# Patient Record
Sex: Female | Born: 1965 | Race: White | Hispanic: No | Marital: Married | Smoking: Never smoker
Health system: Southern US, Community
[De-identification: ages and names within clinical notes are randomized; demographics above are authoritative.]

## PROBLEM LIST (undated history)

## (undated) DIAGNOSIS — Z789 Other specified health status: Secondary | ICD-10-CM

## (undated) HISTORY — PX: ABDOMINAL HYSTERECTOMY: SHX81

## (undated) HISTORY — PX: BLADDER SURGERY: SHX569

---

## 2007-06-09 ENCOUNTER — Ambulatory Visit: Payer: Self-pay | Admitting: Surgery

## 2008-05-16 IMAGING — CT CT ABD-PELV W/ CM
1 of 2 series · 15 of 32 positions shown, 19 images · non-contrast
Comparison: none

REASON FOR EXAM: LYMPHADENOPATHY
COMMENTS:

PROCEDURE:     AMAZIGH - AMAZIGH ABDOMEN / PELVIS W  - June 09, 2007  [DATE]
RESULT:     CT of the abdomen and pelvis is performed utilizing 100 ml of
Msovue-VWW iodinated intravenous contrast along with oral contrast.

[Series 2: soft tissue · axial · 0.67mm/px · z∈[+267,+675]mm · 15 of 57 slices shown, 19 images]
[im 3/57  soft-tissue]
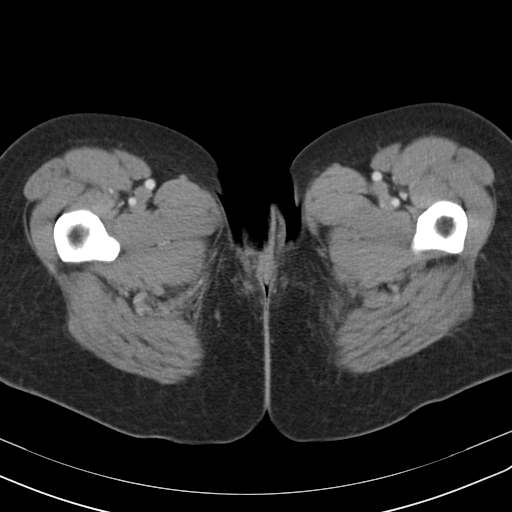
[im 3/57  bone]
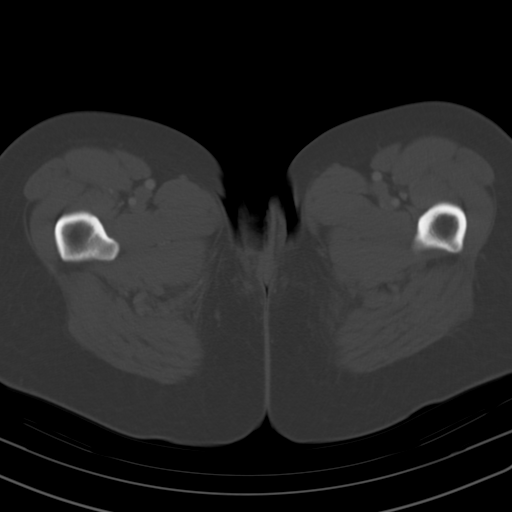
[im 8/57  soft-tissue]
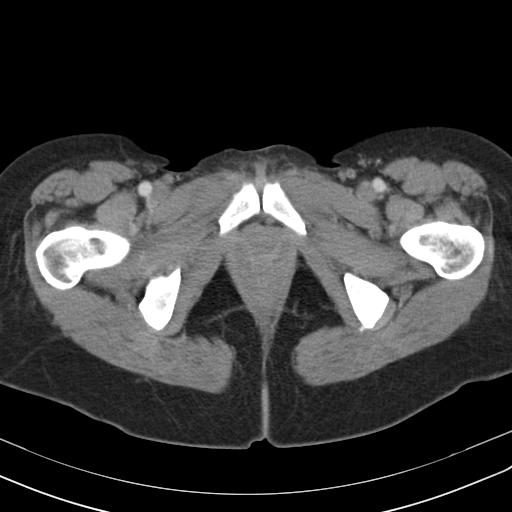
[im 13/57  soft-tissue]
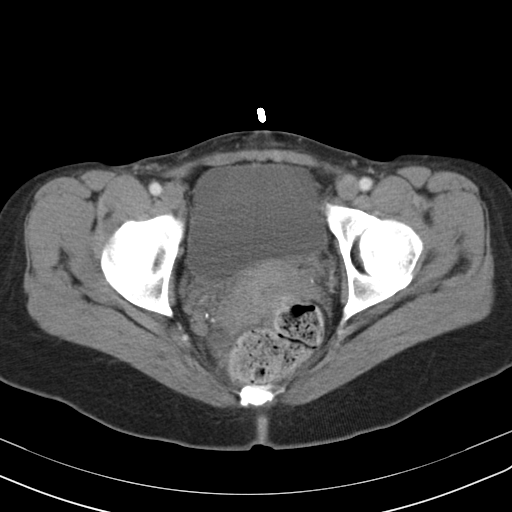
[im 16/57  soft-tissue]
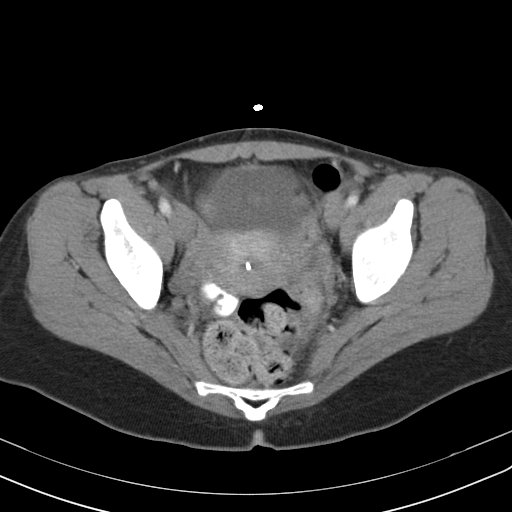
[im 21/57  soft-tissue]
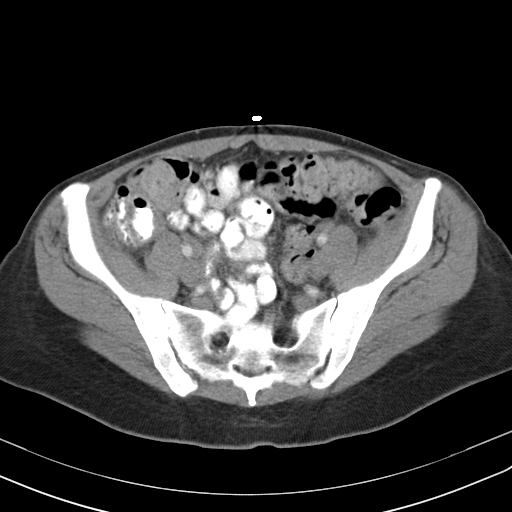
[im 23/57  soft-tissue]
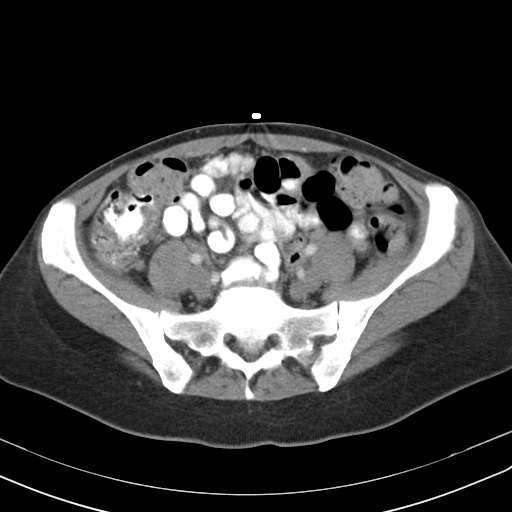
[im 29/57  soft-tissue]
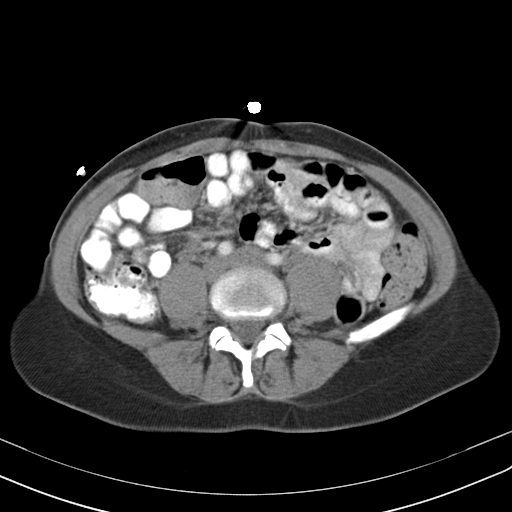
[im 34/57  soft-tissue]
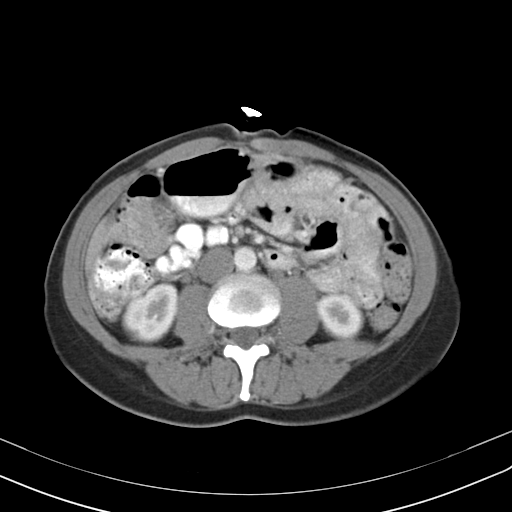
[im 36/57  soft-tissue]
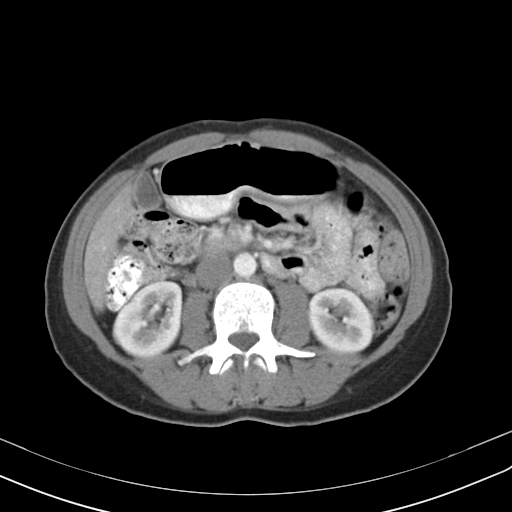
[im 36/57  bone]
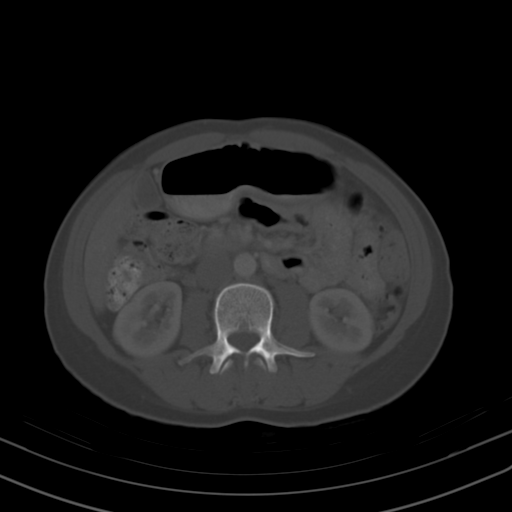
[im 41/57  soft-tissue]
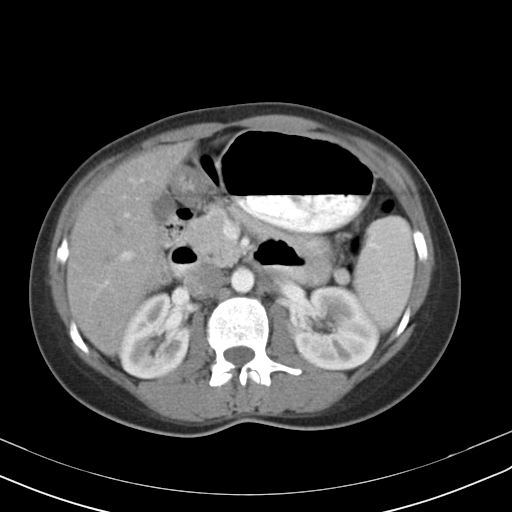
[im 44/57  soft-tissue]
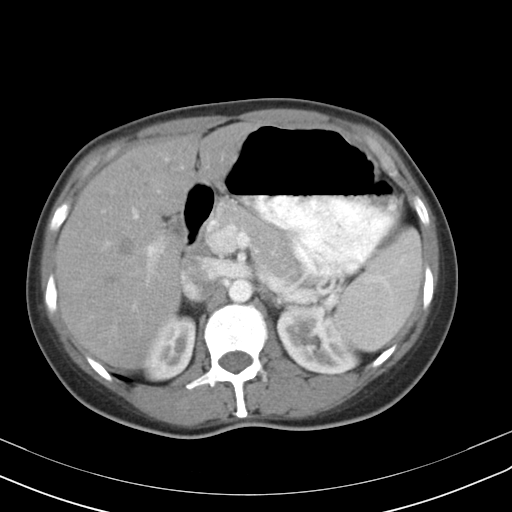
[im 46/57  lung]
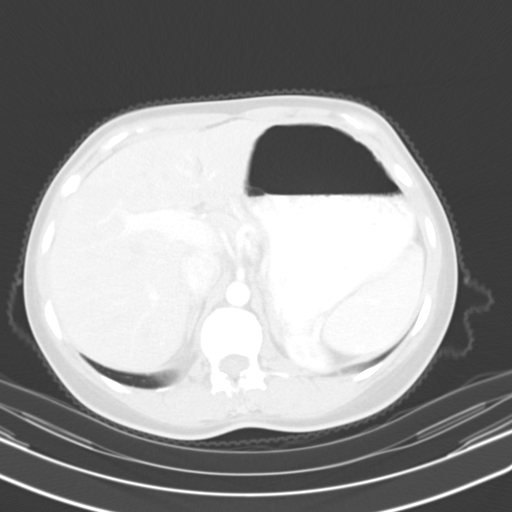
[im 49/57  soft-tissue]
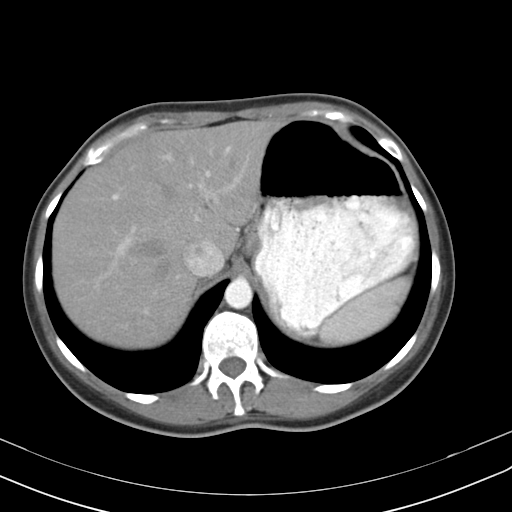
[im 49/57  lung]
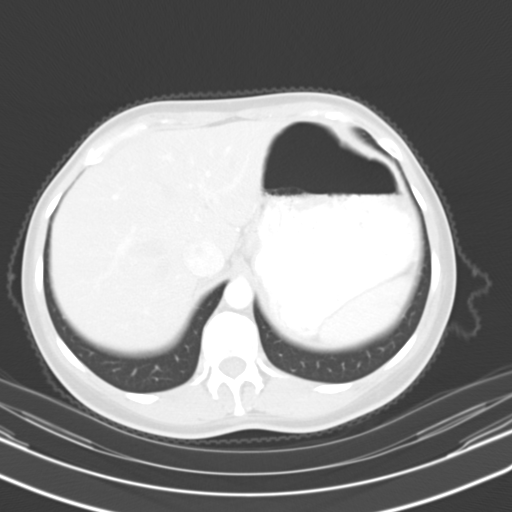
[im 51/57  lung]
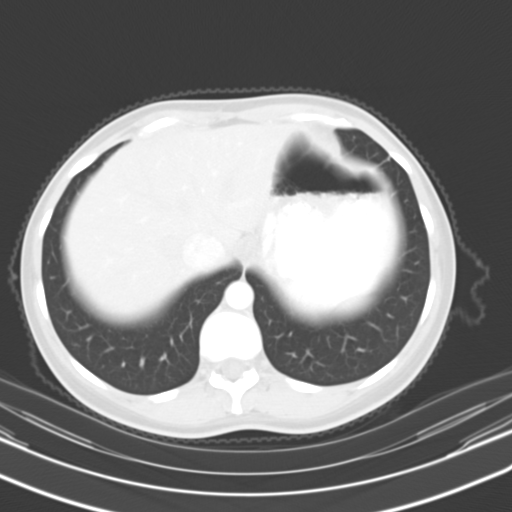
[im 54/57  soft-tissue]
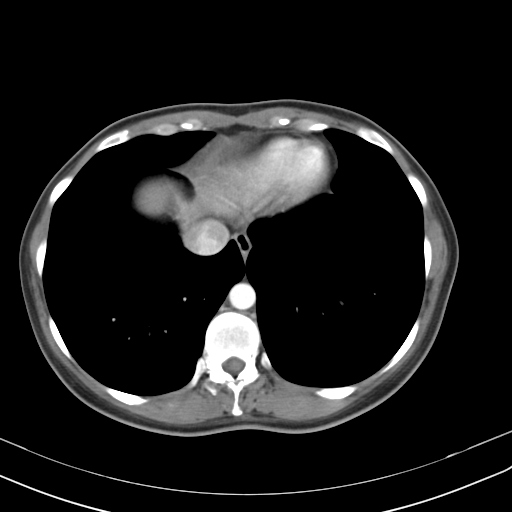
[im 54/57  lung]
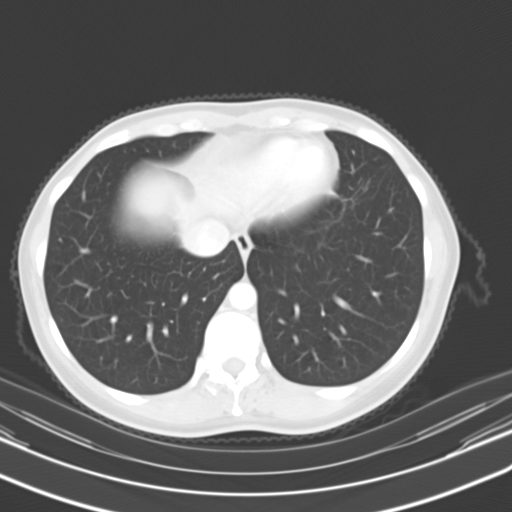

[15 of 32 positions shown; findings below may reference images not displayed]

FINDINGS: Images through the base of the lungs show normal aeration with no
infiltrate, nodule or pneumothorax. There is no effusion. The liver is free
of masses or enlargement. The spleen is unremarkable. The pancreas appears
to be normal. The adrenal glands are unremarkable. The gallbladder shows no
definite stones. There is no abnormal bowel distention. There is no
mesenteric, retroperitoneal or pelvic adenopathy by size criteria. There are
multiple phleboliths, greater on the RIGHT than on the LEFT. There is an
intrauterine device present. There is no evidence of an adnexal mass.
IMPRESSION: No evidence of adenopathy. Unremarkable CT of the abdomen
and pelvis otherwise.

## 2016-02-29 ENCOUNTER — Ambulatory Visit
Admission: EM | Admit: 2016-02-29 | Discharge: 2016-02-29 | Disposition: A | Discharge status: Home / Self Care | Payer: BLUE CROSS/BLUE SHIELD | Attending: Family Medicine | Admitting: Family Medicine

## 2016-02-29 DIAGNOSIS — R21 Rash and other nonspecific skin eruption: Secondary | ICD-10-CM

## 2016-02-29 DIAGNOSIS — B851 Pediculosis due to Pediculus humanus corporis: Secondary | ICD-10-CM

## 2016-02-29 MED ORDER — PERMETHRIN 5 % EX CREA: 1.0000 "application " | TOPICAL_CREAM | Freq: Once | CUTANEOUS | 1 refills | Status: AC

## 2016-02-29 MED ORDER — RANITIDINE HCL 150 MG PO CAPS: 150.0000 mg | ORAL_CAPSULE | Freq: Two times a day (BID) | ORAL | 1 refills | Status: DC

## 2016-02-29 MED ORDER — PREDNISONE 10 MG (21) PO TBPK: ORAL_TABLET | ORAL | 1 refills | Status: DC

## 2016-02-29 NOTE — ED Triage Notes (Signed)
Patient complains of bug bite areas that started over one week ago and have been spreading. Patient states that area is itchy. Patient states that she has tried triamcinolone and benadryl without relief.

## 2016-02-29 NOTE — ED Provider Notes (Signed)
[facility]    CSN: [id number] Arrival date & time: 02/29/16  0901     History   Chief Complaint Chief Complaint  Patient presents with  . Rash    HPI Michelle Barber is a 50 y.o. female.   Patient's here because of rash. She reports the rash started on the left leg. This started about a week ago she's had some changes in her household where a family member had significant bed and she recently finally was able to get back into her bed about a week ago. Since then she saw the dermatologist earlier this week and thought it was simple immune response to some bedbugs or insect that may have bit him however in the last few days she still is a new lesion on her neck on both courses of her hands and new lesion on her right leg as well. These lesions are very pruritic and she said cover them with Band-Aids and she doesn't scratch. Of course when she scratched the lower leg lesions they felt much better. Left lower leg lesions and was also swollen and inflamed from her scratching.  . Patient's had a abdominal hysterectomy bladder tacking surgery. History colon cancer and breast cancer in the family. He does not smoke no known drug allergies.   The history is provided by the patient. No language interpreter was used.  Rash  Location:  Hand, head/neck and leg Head/neck rash location:  R neck Hand rash location:  Dorsum of L hand and R wrist Leg rash location:  L lower leg and R lower leg Severity:  Moderate Onset quality:  Sudden Timing:  Constant Progression:  Spreading Context: animal contact   Relieved by:  Nothing Ineffective treatments:  Topical steroids and antihistamines Associated symptoms: induration     History reviewed. No pertinent past medical history.  There are no active problems to display for this patient.   Past Surgical History:  Procedure Laterality Date  . ABDOMINAL HYSTERECTOMY    . BLADDER SURGERY     Bladder Tac    OB History    No data  available       Home Medications    Prior to Admission medications   Medication Sig Start Date End Date Taking? Authorizing Provider  Cholecalciferol (VITAMIN D PO) Take by mouth.   Yes Historical Provider, MD  permethrin (ELIMITE) 5 % cream Apply 1 application topically once. 02/29/16 02/29/16  Hassan RowanEugene Leanard Dimaio, MD  predniSONE (STERAPRED UNI-PAK 21 TAB) 10 MG (21) TBPK tablet Sig 6 tablet day 1, 5 tablets day 2, 4 tablets day 3,,3tablets day 4, 2 tablets day 5, 1 tablet day 6 take all tablets orally 02/29/16   Hassan RowanEugene Bransyn Adami, MD  ranitidine (ZANTAC) 150 MG capsule Take 1 capsule (150 mg total) by mouth 2 (two) times daily. 02/29/16   Hassan RowanEugene Governor Matos, MD    Family History Family History  Problem Relation Age of Onset  . Breast cancer Mother   . Colon cancer Paternal Grandmother     Social History Social History  Substance Use Topics  . Smoking status: Never Smoker  . Smokeless tobacco: Never Used  . Alcohol use Yes     Comment: rare     Allergies   Review of patient's allergies indicates no known allergies.   Review of Systems Review of Systems  Skin: Positive for rash.  All other systems reviewed and are negative.    Physical Exam Triage Vital Signs ED Triage Vitals  Enc Vitals Group  BP 02/29/16 0926 125/80     Pulse Rate 02/29/16 0926 60     Resp 02/29/16 0926 16     Temp 02/29/16 0926 98.1 F (36.7 C)     Temp Source 02/29/16 0926 Tympanic     SpO2 02/29/16 0926 100 %     Weight 02/29/16 0927 153 lb (69.4 kg)     Height 02/29/16 0927 5\' 7"  (1.702 m)     Head Circumference --      Peak Flow --      Pain Score 02/29/16 0928 0     Pain Loc --      Pain Edu? --      Excl. in GC? --    No data found.   Updated Vital Signs BP 125/80 (BP Location: Left Arm)   Pulse 60   Temp 98.1 F (36.7 C) (Tympanic)   Resp 16   Ht 5\' 7"  (1.702 m)   Wt 153 lb (69.4 kg)   SpO2 100%   BMI 23.96 kg/m   Visual Acuity Right Eye Distance:   Left Eye Distance:     Bilateral Distance:    Right Eye Near:   Left Eye Near:    Bilateral Near:     Physical Exam  Constitutional: She is oriented to person, place, and time. She appears well-developed and well-nourished.  HENT:  Head: Normocephalic.  Eyes: Pupils are equal, round, and reactive to light.  Neck: Normal range of motion.  Pulmonary/Chest: Effort normal.  Musculoskeletal: Normal range of motion.  Neurological: She is alert and oriented to person, place, and time.  Skin: Rash noted. There is erythema.     Psychiatric: She has a normal mood and affect.  Vitals reviewed.    UC Treatments / Results  Labs (all labs ordered are listed, but only abnormal results are displayed) Labs Reviewed - No data to display  EKG  EKG Interpretation None       Radiology No results found.  Procedures Procedures (including critical care time)  Medications Ordered in UC Medications - No data to display   Initial Impression / Assessment and Plan / UC Course  I have reviewed the triage vital signs and the nursing notes.  Pertinent labs & imaging results that were available during my care of the patient were reviewed by me and considered in my medical decision making (see chart for details).  Clinical Course    We have both come to the conclusion that this is probably some form of pediculosis infection. With the spreading of the lesions and no new known bites being seen or fracture seen will treat with Elimite lotion Zantac and Claritin for the itching she has Claritin at home will place on 6 day course of prednisone for the immunoresponse that she has experienced.  Final Clinical Impressions(s) / UC Diagnoses   Final diagnoses:  Rash and nonspecific skin eruption  Pediculosis corporis    New Prescriptions New Prescriptions   PERMETHRIN (ELIMITE) 5 % CREAM    Apply 1 application topically once.   PREDNISONE (STERAPRED UNI-PAK 21 TAB) 10 MG (21) TBPK TABLET    Sig 6 tablet day 1, 5  tablets day 2, 4 tablets day 3,,3tablets day 4, 2 tablets day 5, 1 tablet day 6 take all tablets orally   RANITIDINE (ZANTAC) 150 MG CAPSULE    Take 1 capsule (150 mg total) by mouth 2 (two) times daily.     Hassan RowanEugene Arben Packman, MD 02/29/16 1023

## 2016-03-03 ENCOUNTER — Telehealth: Payer: Self-pay

## 2016-03-03 NOTE — Telephone Encounter (Signed)
Courtesy call back completed today for patient's recent visit at [facility]. Patient did not answer, left message on machine to call back with any questions or concerns.   

## 2017-09-28 ENCOUNTER — Other Ambulatory Visit: Payer: Self-pay

## 2017-09-28 DIAGNOSIS — Z1211 Encounter for screening for malignant neoplasm of colon: Secondary | ICD-10-CM

## 2017-09-29 ENCOUNTER — Telehealth: Payer: Self-pay

## 2017-09-29 NOTE — Telephone Encounter (Signed)
Patient contacted office to reschedule colonoscopy with Dr. Servando Snare.  She has requested to move date to 11/25/17.  LVM at [facility] to notify of date request change.  Will send new instructions to her.  Thanks [employer]

## 2017-10-20 ENCOUNTER — Other Ambulatory Visit: Payer: Self-pay

## 2017-10-20 ENCOUNTER — Ambulatory Visit
Admission: EM | Admit: 2017-10-20 | Discharge: 2017-10-20 | Disposition: A | Discharge status: Home / Self Care | Payer: BLUE CROSS/BLUE SHIELD | Attending: Family Medicine | Admitting: Family Medicine

## 2017-10-20 ENCOUNTER — Encounter: Payer: Self-pay | Admitting: Emergency Medicine

## 2017-10-20 DIAGNOSIS — R300 Dysuria: Secondary | ICD-10-CM | POA: Diagnosis not present

## 2017-10-20 DIAGNOSIS — N3000 Acute cystitis without hematuria: Secondary | ICD-10-CM

## 2017-10-20 DIAGNOSIS — R3915 Urgency of urination: Secondary | ICD-10-CM | POA: Diagnosis not present

## 2017-10-20 LAB — URINALYSIS, COMPLETE (UACMP) WITH MICROSCOPIC
BACTERIA UA: NONE SEEN
Bilirubin Urine: NEGATIVE
Glucose, UA: NEGATIVE mg/dL
LEUKOCYTES UA: NEGATIVE
Nitrite: POSITIVE — AB
PH: 5 (ref 5.0–8.0)
PROTEIN: 30 mg/dL — AB
Specific Gravity, Urine: >1.03 — ABNORMAL HIGH (ref 1.005–1.030)
Squamous Epithelial / LPF: NONE SEEN (ref 0–5)

## 2017-10-20 MED ORDER — NITROFURANTOIN MONOHYD MACRO 100 MG PO CAPS: 100.0000 mg | ORAL_CAPSULE | Freq: Two times a day (BID) | ORAL | 0 refills | Status: DC

## 2017-10-20 NOTE — ED Triage Notes (Signed)
Patient c/o dysuria and urinary frequency that started yesterday. Took Uristat over the counter yesterday.

## 2017-10-20 NOTE — ED Provider Notes (Signed)
[facility]    CSN: [id number] Arrival date & time: 10/20/17  1516     History   Chief Complaint Chief Complaint  Patient presents with  . Dysuria   HPI  52 year old female presents with urinary symptoms.  Patient reports that her symptoms started yesterday.  She reports dysuria, urinary frequency, and urinary urgency.  She reports suprapubic pressure.  No flank pain or back pain.  No fever.  She does endorse chills.  She has taken Uristat without improvement.  No known exacerbating factors.  No known inciting factor.  No other associated symptoms.  No other complaints.  PMH: Uterovaginal prolapse 01/21/2014  SUI (stress urinary incontinence, female) 11/27/2012   Surgical Hx: TONSILLECTOMY      WISDOM TOOTH EXTRACTION 1985     PR VAG HYST,RMV TUBE/OVARY,FIX ENTEROCE 05/14/2014 Vagina /N/A Vagina /N/A Vagina /N/A Perineum/N/A Bladder/N/A Procedure: VAGINAL HYSTERECTOMY, FOR UTERUS 250 G OR LESS; WITH REMOVAL TUBE(S) &/OR OVARY(S), W/REPAIR OF ENTEROCELE; Surgeon: Bernette RedbirdAnnamarie Connolly, MD; [facility]; Service: Pelvic Health  Medical devices from this surgery are in the Implants section.   PR REVAGINAL PROLAPSE,UTEROSACRAL 05/14/2014 Vagina /N/A Vagina /N/A Vagina /N/A Perineum/N/A Bladder/N/A Procedure: CULPOPEXY, VAGINAL; INTRA-PERITONEAL APPROACH (UTEROSACRAL, LEVATOR MYORRHAPHY); Surgeon: Bernette RedbirdAnnamarie Connolly, MD; [facility]; Service: Pelvic Health  Medical devices from this surgery are in the Implants section.   PR CMBND ANTERPOST COLPORRAPHY W/CYSTO 05/14/2014 Vagina /N/A Vagina /N/A Vagina /N/A Perineum/N/A Bladder/N/A Procedure: COMBINED ANTEROPOSTERIOR COLPORRHAPHY; Surgeon: Bernette RedbirdAnnamarie Connolly, MD; [facility]; Service: Pelvic Health  Medical devices from this surgery are in the Implants section.   PR SLING OPER STRES INCONTINENCE 05/14/2014 Vagina /N/A Vagina /N/A Vagina  /N/A Perineum/N/A Bladder/N/A Procedure: SLING OPERATION FOR STRESS INCONTINENCE (EG, FASCIA OR SYNTHETIC); Surgeon: Bernette RedbirdAnnamarie Connolly, MD; [facility]; Service: Pelvic Health  Medical devices from this surgery are in the Implants section.   PR CYSTOURETHROSCOPY 05/14/2014 Vagina /N/A Vagina /N/A Vagina /N/A Perineum/N/A Bladder/N/A Procedure: CYSTOURETHROSCOPY (SEPARATE PROCEDURE); Surgeon: Bernette RedbirdAnnamarie Connolly, MD; [facility]; Service: Pelvic Health  Medical devices from this surgery are in the Implants section.       OB History   None      Home Medications    Prior to Admission medications   Medication Sig Start Date End Date Taking? Authorizing Provider  Cholecalciferol (VITAMIN D PO) Take by mouth.   Yes [provider]  nitrofurantoin, macrocrystal-monohydrate, (MACROBID) 100 MG capsule Take 1 capsule (100 mg total) by mouth 2 (two) times daily. 10/20/17   Tommie Samsook, Eladia Frame G, DO    Family History Family History  Problem Relation Age of Onset  . Breast cancer Mother   . Colon cancer Paternal Grandmother     Social History Social History   Tobacco Use  . Smoking status: Never Smoker  . Smokeless tobacco: Never Used  Substance Use Topics  . Alcohol use: Yes    Comment: rare  . Drug use: No     Allergies   Patient has no known allergies.   Review of Systems Review of Systems  Constitutional: Positive for chills. Negative for fever.  Genitourinary: Positive for dysuria, frequency and urgency. Negative for flank pain.   Physical Exam Triage Vital Signs ED Triage Vitals  Enc Vitals Group     BP 10/20/17 1527 (!) 120/93     Pulse Rate 10/20/17 1527 73     Resp 10/20/17 1527 18     Temp 10/20/17 1527 98.2  F (36.8 C)     Temp Source 10/20/17 1527 Oral     SpO2 10/20/17 1527 100 %     Weight 10/20/17 1525 165 lb (74.8 kg)     Height 10/20/17 1525 5\' 7"  (1.702 m)     Head Circumference --      Peak Flow --       Pain Score 10/20/17 1525 4     Pain Loc --      Pain Edu? --      Excl. in GC? --    Updated Vital Signs BP (!) 120/93 (BP Location: Left Arm)   Pulse 73   Temp 98.2 F (36.8 C) (Oral)   Resp 18   Ht 5\' 7"  (1.702 m)   Wt 165 lb (74.8 kg)   SpO2 100%   BMI 25.84 kg/m   Physical Exam  Constitutional: She is oriented to person, place, and time. She appears well-developed. No distress.  HENT:  Head: Normocephalic and atraumatic.  Cardiovascular: Normal rate and regular rhythm.  Pulmonary/Chest: Effort normal and breath sounds normal. She has no wheezes. She has no rales.  Abdominal: Soft. She exhibits no distension.  Mild suprapubic tenderness.  Neurological: She is alert and oriented to person, place, and time.  Psychiatric: She has a normal mood and affect. Her behavior is normal.  Nursing note and vitals reviewed.  UC Treatments / Results  Labs (all labs ordered are listed, but only abnormal results are displayed) Labs Reviewed  URINALYSIS, COMPLETE (UACMP) WITH MICROSCOPIC - Abnormal; Notable for the following components:      Result Value   Specific Gravity, Urine >1.030 (*)    Hgb urine dipstick SMALL (*)    Ketones, ur TRACE (*)    Protein, ur 30 (*)    Nitrite POSITIVE (*)    All other components within normal limits  URINE CULTURE    EKG None  Radiology No results found.  Procedures Procedures (including critical care time)  Medications Ordered in UC Medications - No data to display  Initial Impression / Assessment and Plan / UC Course  I have reviewed the triage vital signs and the nursing notes.  Pertinent labs & imaging results that were available during my care of the patient were reviewed by me and considered in my medical decision making (see chart for details).    52 year old female presents with UTI.  Treating with Macrobid.  Sending culture.  Final Clinical Impressions(s) / UC Diagnoses   Final diagnoses:  Acute cystitis without  hematuria     Discharge Instructions     Antibiotic as prescribed.  Take care  Dr. Adriana Simas     ED Prescriptions    Medication Sig Dispense Auth. Provider   nitrofurantoin, macrocrystal-monohydrate, (MACROBID) 100 MG capsule Take 1 capsule (100 mg total) by mouth 2 (two) times daily. 14 capsule Tommie Sams, DO     Controlled Substance Prescriptions [place] Controlled Substance Registry consulted? Not Applicable   Tommie Sams, DO 10/20/17 1556

## 2017-10-20 NOTE — Discharge Instructions (Signed)
Antibiotic as prescribed.  Take care  Dr. Cash Meadow  

## 2017-10-22 LAB — URINE CULTURE: Culture: NO GROWTH

## 2017-11-17 ENCOUNTER — Other Ambulatory Visit: Payer: Self-pay

## 2017-11-17 ENCOUNTER — Encounter: Payer: Self-pay | Admitting: *Deleted

## 2017-11-24 NOTE — Discharge Instructions (Signed)
General Anesthesia, Adult, Care After °These instructions provide you with information about caring for yourself after your procedure. Your health care provider may also give you more specific instructions. Your treatment has been planned according to current medical practices, but problems sometimes occur. Call your health care provider if you have any problems or questions after your procedure. °What can I expect after the procedure? °After the procedure, it is common to have: °· Vomiting. °· A sore throat. °· Mental slowness. ° °It is common to feel: °· Nauseous. °· Cold or shivery. °· Sleepy. °· Tired. °· Sore or achy, even in parts of your body where you did not have surgery. ° °Follow these instructions at home: °For at least 24 hours after the procedure: °· Do not: °? Participate in activities where you could fall or become injured. °? Drive. °? Use heavy machinery. °? Drink alcohol. °? Take sleeping pills or medicines that cause drowsiness. °? Make important decisions or sign legal documents. °? Take care of children on your own. °· Rest. °Eating and drinking °· If you vomit, drink water, juice, or soup when you can drink without vomiting. °· Drink enough fluid to keep your urine clear or pale yellow. °· Make sure you have little or no nausea before eating solid foods. °· Follow the diet recommended by your health care provider. °General instructions °· Have a responsible adult stay with you until you are awake and alert. °· Return to your normal activities as told by your health care provider. Ask your health care provider what activities are safe for you. °· Take over-the-counter and prescription medicines only as told by your health care provider. °· If you smoke, do not smoke without supervision. °· Keep all follow-up visits as told by your health care provider. This is important. °Contact a health care provider if: °· You continue to have nausea or vomiting at home, and medicines are not helpful. °· You  cannot drink fluids or start eating again. °· You cannot urinate after 8-12 hours. °· You develop a skin rash. °· You have fever. °· You have increasing redness at the site of your procedure. °Get help right away if: °· You have difficulty breathing. °· You have chest pain. °· You have unexpected bleeding. °· You feel that you are having a life-threatening or urgent problem. °This information is not intended to replace advice given to you by your health care provider. Make sure you discuss any questions you have with your health care provider. °Document Released: 07/26/2000 Document Revised: 09/22/2015 Document Reviewed: 04/03/2015 °Elsevier Interactive Patient Education © 2018 [employer] ° °

## 2017-11-25 ENCOUNTER — Encounter
Admission: RE | Disposition: A | Discharge status: Home / Self Care | Payer: Self-pay | Source: Ambulatory Visit | Attending: Gastroenterology

## 2017-11-25 ENCOUNTER — Ambulatory Visit
Admission: RE | Admit: 2017-11-25 | Discharge: 2017-11-25 | Disposition: A | Discharge status: Home / Self Care | Payer: BLUE CROSS/BLUE SHIELD | Source: Ambulatory Visit | Attending: Gastroenterology | Admitting: Gastroenterology

## 2017-11-25 ENCOUNTER — Ambulatory Visit: Payer: BLUE CROSS/BLUE SHIELD | Admitting: Anesthesiology

## 2017-11-25 DIAGNOSIS — Z1211 Encounter for screening for malignant neoplasm of colon: Secondary | ICD-10-CM | POA: Diagnosis not present

## 2017-11-25 DIAGNOSIS — Z800 Family history of malignant neoplasm of digestive organs: Secondary | ICD-10-CM | POA: Diagnosis not present

## 2017-11-25 HISTORY — PX: COLONOSCOPY WITH PROPOFOL: SHX5780

## 2017-11-25 HISTORY — DX: Other specified health status: Z78.9

## 2017-11-25 SURGERY — COLONOSCOPY WITH PROPOFOL
Anesthesia: General | Wound class: Contaminated

## 2017-11-25 MED ORDER — LIDOCAINE HCL (CARDIAC) PF 100 MG/5ML IV SOSY
PREFILLED_SYRINGE | INTRAVENOUS | Status: DC | PRN
  Administered 2017-11-25: 30 mg via INTRAVENOUS

## 2017-11-25 MED ORDER — PROMETHAZINE HCL 25 MG/ML IJ SOLN: 6.2500 mg | INTRAMUSCULAR | Status: DC | PRN

## 2017-11-25 MED ORDER — LACTATED RINGERS IV SOLN
INTRAVENOUS | Status: DC
  Administered 2017-11-25 (×2): via INTRAVENOUS

## 2017-11-25 MED ORDER — LACTATED RINGERS IV SOLN: INTRAVENOUS | Status: DC

## 2017-11-25 MED ORDER — OXYCODONE HCL 5 MG/5ML PO SOLN: 5.0000 mg | Freq: Once | ORAL | Status: DC | PRN

## 2017-11-25 MED ORDER — PROPOFOL 10 MG/ML IV BOLUS
INTRAVENOUS | Status: DC | PRN
  Administered 2017-11-25 (×2): 30 mg via INTRAVENOUS
  Administered 2017-11-25: 100 mg via INTRAVENOUS
  Administered 2017-11-25 (×5): 30 mg via INTRAVENOUS

## 2017-11-25 MED ORDER — FENTANYL CITRATE (PF) 100 MCG/2ML IJ SOLN: 25.0000 ug | INTRAMUSCULAR | Status: DC | PRN

## 2017-11-25 MED ORDER — OXYCODONE HCL 5 MG PO TABS: 5.0000 mg | ORAL_TABLET | Freq: Once | ORAL | Status: DC | PRN

## 2017-11-25 MED ORDER — SODIUM CHLORIDE 0.9 % IV SOLN: INTRAVENOUS | Status: DC

## 2017-11-25 SURGICAL SUPPLY — 24 items

## 2017-11-25 NOTE — Anesthesia Preprocedure Evaluation (Signed)
Anesthesia Evaluation  Patient identified by MRN, date of birth, ID band Patient awake    Reviewed: Allergy & Precautions, NPO status , Patient's Chart, lab work & pertinent test results  Airway Mallampati: II  TM Distance: >3 FB Neck ROM: Full    Dental no notable dental hx.    Pulmonary neg pulmonary ROS,    Pulmonary exam normal breath sounds clear to auscultation       Cardiovascular negative cardio ROS Normal cardiovascular exam Rhythm:Regular Rate:Normal     Neuro/Psych negative neurological ROS  negative psych ROS   GI/Hepatic negative GI ROS, Neg liver ROS,   Endo/Other  negative endocrine ROS  Renal/GU negative Renal ROS  negative genitourinary   Musculoskeletal negative musculoskeletal ROS (+)   Abdominal   Peds negative pediatric ROS (+)  Hematology negative hematology ROS (+)   Anesthesia Other Findings   Reproductive/Obstetrics negative OB ROS                             Anesthesia Physical Anesthesia Plan  ASA: I  Anesthesia Plan: General   Post-op Pain Management:    Induction: Intravenous  PONV Risk Score and Plan:   Airway Management Planned: Natural Airway  Additional Equipment:   Intra-op Plan:   Post-operative Plan: Extubation in OR  Informed Consent: I have reviewed the patients History and Physical, chart, labs and discussed the procedure including the risks, benefits and alternatives for the proposed anesthesia with the patient or authorized representative who has indicated his/her understanding and acceptance.   Dental advisory given  Plan Discussed with: CRNA  Anesthesia Plan Comments: (General IVA with natural airway.)        Anesthesia Quick Evaluation

## 2017-11-25 NOTE — H&P (Signed)
Midge Miniumarren Jackline Castilla, MD [facility] [address] [place], [place] [address] Phone: [phone] Fax : [phone]  Primary Care Physician:  Overton MamGilbert, Amy E, FNP Primary Gastroenterologist:  Dr. Servando SnareWohl  Pre-Procedure History & Physical: HPI:  Michelle Barber is a 52 y.o. female is here for a screening colonoscopy.   Past Medical History:  Diagnosis Date  . Medical history non-contributory     Past Surgical History:  Procedure Laterality Date  . ABDOMINAL HYSTERECTOMY    . BLADDER SURGERY     Bladder Tac    Prior to Admission medications   Medication Sig Start Date End Date Taking? Authorizing Provider  Cholecalciferol (VITAMIN D PO) Take by mouth.   Yes [provider]    Allergies as of 09/28/2017  . (No Known Allergies)    Family History  Problem Relation Age of Onset  . Breast cancer Mother   . Colon cancer Paternal Grandmother     Social History   Socioeconomic History  . Marital status: Married    Spouse name: Not on file  . Number of children: Not on file  . Years of education: Not on file  . Highest education level: Not on file  Occupational History  . Not on file  Social Needs  . Financial resource strain: Not on file  . Food insecurity:    Worry: Not on file    Inability: Not on file  . Transportation needs:    Medical: Not on file    Non-medical: Not on file  Tobacco Use  . Smoking status: Never Smoker  . Smokeless tobacco: Never Used  Substance and Sexual Activity  . Alcohol use: Yes    Comment: rare -1-2x/yr  . Drug use: No  . Sexual activity: Not on file  Lifestyle  . Physical activity:    Days per week: Not on file    Minutes per session: Not on file  . Stress: Not on file  Relationships  . Social connections:    Talks on phone: Not on file    Gets together: Not on file    Attends religious service: Not on file    Active member of club or organization: Not on file    Attends meetings of clubs or organizations: Not on file   Relationship status: Not on file  . Intimate partner violence:    Fear of current or ex partner: Not on file    Emotionally abused: Not on file    Physically abused: Not on file    Forced sexual activity: Not on file  Other Topics Concern  . Not on file  Social History Narrative  . Not on file    Review of Systems: See HPI, otherwise negative ROS  Physical Exam: BP 126/83   Pulse 82   Temp 97.7 F (36.5 C) (Temporal)   Resp 16   Ht 5\' 7"  (1.702 m)   Wt 167 lb (75.8 kg)   SpO2 98%   BMI 26.16 kg/m  General:   Alert,  pleasant and cooperative in NAD Head:  Normocephalic and atraumatic. Neck:  Supple; no masses or thyromegaly. Lungs:  Clear throughout to auscultation.    Heart:  Regular rate and rhythm. Abdomen:  Soft, nontender and nondistended. Normal bowel sounds, without guarding, and without rebound.   Neurologic:  Alert and  oriented x4;  grossly normal neurologically.  Impression/Plan: Michelle Barber is now here to undergo a screening colonoscopy.  Risks, benefits, and alternatives regarding colonoscopy have been  reviewed with the patient.  Questions have been answered.  All parties agreeable.

## 2017-11-25 NOTE — Anesthesia Postprocedure Evaluation (Signed)
Anesthesia Post Note  Patient: Michelle Barber  Procedure(s) Performed: COLONOSCOPY WITH PROPOFOL (N/A )  Patient location during evaluation: PACU Anesthesia Type: General Level of consciousness: awake and alert Pain management: pain level controlled Vital Signs Assessment: post-procedure vital signs reviewed and stable Respiratory status: spontaneous breathing, nonlabored ventilation, respiratory function stable and patient connected to nasal cannula oxygen Cardiovascular status: blood pressure returned to baseline and stable Postop Assessment: no apparent nausea or vomiting Anesthetic complications: no    Nayshawn Mesta C

## 2017-11-25 NOTE — Op Note (Signed)
[facility] Gastroenterology Patient Name: Michelle Barber Procedure Date: 11/25/2017 7:26 AM MRN: [id number] Account #: [id number] Date of Birth: 1965/10/31 Admit Type: Outpatient Age: 52 Room: [facility] OR ROOM 01 Gender: Female Note Status: Finalized Procedure:            Colonoscopy Indications:          Screening for colorectal malignant neoplasm Providers:            Midge Minium MD, MD Medicines:            Propofol per Anesthesia Complications:        No immediate complications. Procedure:            Pre-Anesthesia Assessment:                       - Prior to the procedure, a History and Physical was                        performed, and patient medications and allergies were                        reviewed. The patient's tolerance of previous                        anesthesia was also reviewed. The risks and benefits of                        the procedure and the sedation options and risks were                        discussed with the patient. All questions were                        answered, and informed consent was obtained. Prior                        Anticoagulants: The patient has taken no previous                        anticoagulant or antiplatelet agents. ASA Grade                        Assessment: II - A patient with mild systemic disease.                        After reviewing the risks and benefits, the patient was                        deemed in satisfactory condition to undergo the                        procedure.                       After obtaining informed consent, the colonoscope was                        passed under direct vision. Throughout the procedure,                        the patient's blood pressure,  pulse, and oxygen                        saturations were monitored continuously. The was                        introduced through the anus and advanced to the the                        cecum, identified by appendiceal orifice and  ileocecal                        valve. The colonoscopy was performed without                        difficulty. The patient tolerated the procedure well.                        The quality of the bowel preparation was excellent. Findings:      The perianal and digital rectal examinations were normal.      The entire examined colon appeared normal. Impression:           - The entire examined colon is normal.                       - No specimens collected. Recommendation:       - Discharge patient to home.                       - Resume previous diet.                       - Continue present medications.                       - Repeat colonoscopy in 10 years for screening unless                        any change in family history or lower GI problems. Procedure Code(s):    --- Professional ---                       [id number], Colonoscopy, flexible; diagnostic, including                        collection of specimen(s) by brushing or washing, when                        performed (separate procedure) Diagnosis Code(s):    --- Professional ---                       Z12.11, Encounter for screening for malignant neoplasm                        of colon CPT copyright 2017 American Medical Association. All rights reserved. The codes documented in this report are preliminary and upon coder review may  be revised to meet current compliance requirements. Midge Miniumarren Dorothea Yow MD, MD 11/25/2017 7:55:19 AM This report has been signed electronically. Number of Addenda: 0 Note Initiated On: 11/25/2017 7:26 AM Scope Withdrawal Time: 0 hours 7 minutes 4 seconds  Total Procedure  Duration: 0 hours 13 minutes 6 seconds       [facility]

## 2017-11-25 NOTE — Anesthesia Procedure Notes (Signed)
Procedure Name: MAC Date/Time: 11/25/2017 7:35 AM Performed by: Lind Guest, CRNA Pre-anesthesia Checklist: Patient identified, Emergency Drugs available, Suction available, Patient being monitored and Timeout performed Patient Re-evaluated:Patient Re-evaluated prior to induction Oxygen Delivery Method: Nasal cannula

## 2017-11-25 NOTE — Transfer of Care (Signed)
Immediate Anesthesia Transfer of Care Note  Patient: Michelle Barber  Procedure(s) Performed: COLONOSCOPY WITH PROPOFOL (N/A )  Patient Location: PACU  Anesthesia Type: General  Level of Consciousness: awake, alert  and patient cooperative  Airway and Oxygen Therapy: Patient Spontanous Breathing and Patient connected to supplemental oxygen  Post-op Assessment: Post-op Vital signs reviewed, Patient's Cardiovascular Status Stable, Respiratory Function Stable, Patent Airway and No signs of Nausea or vomiting  Post-op Vital Signs: Reviewed and stable  Complications: No apparent anesthesia complications

## 2018-04-18 ENCOUNTER — Encounter (HOSPITAL_COMMUNITY): Payer: Self-pay | Admitting: Family Medicine

## 2018-04-18 ENCOUNTER — Ambulatory Visit (HOSPITAL_COMMUNITY)
Admission: EM | Admit: 2018-04-18 | Discharge: 2018-04-18 | Disposition: A | Discharge status: Home / Self Care | Payer: BLUE CROSS/BLUE SHIELD | Attending: Family Medicine | Admitting: Family Medicine

## 2018-04-18 DIAGNOSIS — Z79899 Other long term (current) drug therapy: Secondary | ICD-10-CM | POA: Insufficient documentation

## 2018-04-18 DIAGNOSIS — N390 Urinary tract infection, site not specified: Secondary | ICD-10-CM | POA: Diagnosis not present

## 2018-04-18 DIAGNOSIS — Z9889 Other specified postprocedural states: Secondary | ICD-10-CM | POA: Insufficient documentation

## 2018-04-18 LAB — POCT URINALYSIS DIP (DEVICE)
Bilirubin Urine: NEGATIVE
Glucose, UA: NEGATIVE mg/dL
KETONES UR: NEGATIVE mg/dL
NITRITE: NEGATIVE
PH: 5.5 (ref 5.0–8.0)
PROTEIN: 30 mg/dL — AB
Specific Gravity, Urine: >=1.03 (ref 1.005–1.030)
Urobilinogen, UA: 0.2 mg/dL (ref 0.0–1.0)

## 2018-04-18 MED ORDER — SULFAMETHOXAZOLE-TRIMETHOPRIM 800-160 MG PO TABS: 1.0000 | ORAL_TABLET | Freq: Two times a day (BID) | ORAL | 0 refills | Status: DC

## 2018-04-18 NOTE — ED Notes (Signed)
PT seen, triaged, and discharged by provider

## 2018-04-18 NOTE — ED Provider Notes (Signed)
[facility]    CSN: [id number] Arrival date & time: 04/18/18  1007     History   Chief Complaint No chief complaint on file.   HPI Michelle Barber is a 52 y.o. female.   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  She is complaining about urinary tract infection symptoms.  She has had urinary tract infections fairly recently.     Past Medical History:  Diagnosis Date  . Medical history non-contributory     Patient Active Problem List   Diagnosis Date Noted  . Encounter for screening colonoscopy     Past Surgical History:  Procedure Laterality Date  . ABDOMINAL HYSTERECTOMY    . BLADDER SURGERY     Bladder Tac  . COLONOSCOPY WITH PROPOFOL N/A 11/25/2017   Procedure: COLONOSCOPY WITH PROPOFOL;  Surgeon: Midge MiniumWohl, Darren, MD;  [facility];  Service: Endoscopy;  Laterality: N/A;  needs to be first    OB History   No obstetric history on file.      Home Medications    Prior to Admission medications   Medication Sig Start Date End Date Taking? Authorizing Provider  Cholecalciferol (VITAMIN D PO) Take by mouth.    [provider]  sulfamethoxazole-trimethoprim (BACTRIM DS,SEPTRA DS) 800-160 MG tablet Take 1 tablet by mouth 2 (two) times daily for 7 days. 04/18/18 04/25/18  Elvina SidleLauenstein, Suvan Stcyr, MD    Family History Family History  Problem Relation Age of Onset  . Breast cancer Mother   . Colon cancer Paternal Grandmother     Social History Social History   Tobacco Use  . Smoking status: Never Smoker  . Smokeless tobacco: Never Used  Substance Use Topics  . Alcohol use: Yes    Comment: rare -1-2x/yr  . Drug use: No     Allergies   Patient has no known allergies.   Review of Systems Review of Systems   Physical Exam Triage Vital Signs ED Triage Vitals  Enc Vitals Group     BP      Pulse      Resp      Temp      Temp src      SpO2      Weight      Height      Head Circumference      Peak  Flow      Pain Score      Pain Loc      Pain Edu?      Excl. in GC?    No data found.  Updated Vital Signs BP 138/88 (BP Location: Left Arm)   Pulse 66   Temp 97.7 F (36.5 C) (Oral)   Resp 18   SpO2 100%    Physical Exam Vitals signs and nursing note reviewed.  Constitutional:      Appearance: Normal appearance.  Neck:     Musculoskeletal: Normal range of motion and neck supple.  Pulmonary:     Effort: Pulmonary effort is normal.  Musculoskeletal: Normal range of motion.  Skin:    General: Skin is warm and dry.  Neurological:     General: No focal deficit present.     Mental Status: She is alert and oriented to person, place, and time.  Psychiatric:        Mood and Affect: Mood normal.      UC Treatments / Results  Labs (all labs ordered are listed, but only abnormal results are displayed) Labs  Reviewed  POCT URINALYSIS DIP (DEVICE) - Abnormal; Notable for the following components:      Result Value   Hgb urine dipstick MODERATE (*)    Protein, ur 30 (*)    Leukocytes, UA SMALL (*)    All other components within normal limits  URINE CULTURE    EKG None  Radiology No results found.  Procedures Procedures (including critical care time)  Medications Ordered in UC Medications - No data to display  Initial Impression / Assessment and Plan / UC Course  I have reviewed the triage vital signs and the nursing notes.  Pertinent labs & imaging results that were available during my care of the patient were reviewed by me and considered in my medical decision making (see chart for details).    Final Clinical Impressions(s) / UC Diagnoses   Final diagnoses:  Urinary tract infection without hematuria, site unspecified   Discharge Instructions   None    ED Prescriptions    Medication Sig Dispense Auth. Provider   sulfamethoxazole-trimethoprim (BACTRIM DS,SEPTRA DS) 800-160 MG tablet Take 1 tablet by mouth 2 (two) times daily for 7 days. 14 tablet  Elvina Sidle, MD     Controlled Substance Prescriptions [place] Controlled Substance Registry consulted? Not Applicable   Elvina Sidle, MD 04/18/18 1238

## 2018-04-19 LAB — URINE CULTURE

## 2018-04-21 ENCOUNTER — Telehealth (HOSPITAL_COMMUNITY): Payer: Self-pay | Admitting: Physician Assistant

## 2018-04-21 MED ORDER — CEPHALEXIN 500 MG PO CAPS: 500.0000 mg | ORAL_CAPSULE | Freq: Two times a day (BID) | ORAL | 0 refills | Status: AC

## 2018-04-21 NOTE — Telephone Encounter (Signed)
Still with urinary symptoms with mild hematuria. Left flank pain with chills. No fever. No nausea, vomiting. Will switch to keflex and monitor symptoms.

## 2018-05-17 ENCOUNTER — Ambulatory Visit (INDEPENDENT_AMBULATORY_CARE_PROVIDER_SITE_OTHER): Payer: BLUE CROSS/BLUE SHIELD

## 2018-05-17 ENCOUNTER — Ambulatory Visit
Admission: EM | Admit: 2018-05-17 | Discharge: 2018-05-17 | Disposition: A | Discharge status: Home / Self Care | Payer: BLUE CROSS/BLUE SHIELD | Attending: Family Medicine | Admitting: Family Medicine

## 2018-05-17 ENCOUNTER — Other Ambulatory Visit: Payer: Self-pay

## 2018-05-17 DIAGNOSIS — J0100 Acute maxillary sinusitis, unspecified: Secondary | ICD-10-CM | POA: Insufficient documentation

## 2018-05-17 DIAGNOSIS — R509 Fever, unspecified: Secondary | ICD-10-CM

## 2018-05-17 DIAGNOSIS — R05 Cough: Secondary | ICD-10-CM | POA: Diagnosis not present

## 2018-05-17 DIAGNOSIS — R059 Cough, unspecified: Secondary | ICD-10-CM

## 2018-05-17 DIAGNOSIS — R0981 Nasal congestion: Secondary | ICD-10-CM | POA: Diagnosis not present

## 2018-05-17 LAB — RAPID INFLUENZA A&B ANTIGENS
Influenza A (ARMC): NEGATIVE
Influenza B (ARMC): NEGATIVE

## 2018-05-17 MED ORDER — AZITHROMYCIN 200 MG/5ML PO SUSR: ORAL | 0 refills | Status: DC

## 2018-05-17 NOTE — ED Provider Notes (Signed)
[facility]    CSN: [id number] Arrival date & time: 05/17/18  0907     History   Chief Complaint Chief Complaint  Patient presents with  . Fever  . Cough    HPI Michelle Barber is a 53 y.o. female.   The history is provided by the patient.  Fever  Associated symptoms: congestion and cough   Cough  Associated symptoms: fever   URI  Presenting symptoms: congestion, cough and fever   Severity:  Moderate Onset quality:  Sudden Duration:  10 days Timing:  Constant Progression:  Unchanged Chronicity:  New Relieved by:  OTC medications Risk factors: sick contacts     Past Medical History:  Diagnosis Date  . Medical history non-contributory     Patient Active Problem List   Diagnosis Date Noted  . Encounter for screening colonoscopy     Past Surgical History:  Procedure Laterality Date  . ABDOMINAL HYSTERECTOMY    . BLADDER SURGERY     Bladder Tac  . COLONOSCOPY WITH PROPOFOL N/A 11/25/2017   Procedure: COLONOSCOPY WITH PROPOFOL;  Surgeon: Midge Minium, MD;  Location: [facility];  Service: Endoscopy;  Laterality: N/A;  needs to be first    OB History   No obstetric history on file.      Home Medications    Prior to Admission medications   Medication Sig Start Date End Date Taking? Authorizing Provider  azithromycin (ZITHROMAX) 200 MG/5ML suspension 12.5 ml po once day one, then 6.3ml po qd for next 4 days 05/17/18   Payton Mccallum, MD  Cholecalciferol (VITAMIN D PO) Take by mouth.    [provider]    Family History Family History  Problem Relation Age of Onset  . Breast cancer Mother   . Colon cancer Paternal Grandmother     Social History Social History   Tobacco Use  . Smoking status: Never Smoker  . Smokeless tobacco: Never Used  Substance Use Topics  . Alcohol use: Yes    Comment: rare -1-2x/yr  . Drug use: No     Allergies   Other   Review of Systems Review of Systems  Constitutional: Positive for  fever.  HENT: Positive for congestion.   Respiratory: Positive for cough.      Physical Exam Triage Vital Signs ED Triage Vitals  Enc Vitals Group     BP 05/17/18 0920 (!) 142/89     Pulse Rate 05/17/18 0920 65     Resp 05/17/18 0920 16     Temp 05/17/18 0920 98.2 F (36.8 C)     Temp Source 05/17/18 0920 Oral     SpO2 05/17/18 0920 100 %     Weight 05/17/18 0922 170 lb (77.1 kg)     Height 05/17/18 0922 5\' 7"  (1.702 m)     Head Circumference --      Peak Flow --      Pain Score 05/17/18 0922 3     Pain Loc --      Pain Edu? --      Excl. in GC? --    No data found.  Updated Vital Signs BP (!) 142/89 (BP Location: Left Arm)   Pulse 65   Temp 98.2 F (36.8 C) (Oral)   Resp 16   Ht 5\' 7"  (1.702 m)   Wt 77.1 kg   SpO2 100%   BMI 26.63 kg/m   Visual Acuity Right Eye Distance:   Left Eye Distance:   Bilateral Distance:  Right Eye Near:   Left Eye Near:    Bilateral Near:     Physical Exam Vitals signs and nursing note reviewed.  Constitutional:      General: She is not in acute distress.    Appearance: She is well-developed. She is not toxic-appearing or diaphoretic.  HENT:     Head: Normocephalic and atraumatic.     Mouth/Throat:     Pharynx: Uvula midline. No oropharyngeal exudate.     Comments: Post nasal drainage Neck:     Musculoskeletal: Normal range of motion and neck supple.     Thyroid: No thyromegaly.  Cardiovascular:     Rate and Rhythm: Normal rate and regular rhythm.     Heart sounds: Normal heart sounds.  Pulmonary:     Effort: Pulmonary effort is normal. No respiratory distress.     Breath sounds: Normal breath sounds. No stridor. No wheezing, rhonchi or rales.  Lymphadenopathy:     Cervical: No cervical adenopathy.  Neurological:     Mental Status: She is alert.      UC Treatments / Results  Labs (all labs ordered are listed, but only abnormal results are displayed) Labs Reviewed  RAPID INFLUENZA A&B ANTIGENS ([facility] ONLY)     EKG None  Radiology Dg Chest 2 View  Result Date: 05/17/2018 CLINICAL DATA:  Cough and congestion EXAM: CHEST - 2 VIEW COMPARISON:  None. FINDINGS: Lungs are clear. Heart size and pulmonary vascularity are normal. No adenopathy. No bone lesions. IMPRESSION: No edema or consolidation. Electronically Signed   By: Bretta BangWilliam  Woodruff III M.D.   On: 05/17/2018 09:45    Procedures Procedures (including critical care time)  Medications Ordered in UC Medications - No data to display  Initial Impression / Assessment and Plan / UC Course  I have reviewed the triage vital signs and the nursing notes.  Pertinent labs & imaging results that were available during my care of the patient were reviewed by me and considered in my medical decision making (see chart for details).      Final Clinical Impressions(s) / UC Diagnoses   Final diagnoses:  Acute maxillary sinusitis, recurrence not specified  Cough     Discharge Instructions     Rest, fluids, over the counter cold/cough medications as needed    ED Prescriptions    Medication Sig Dispense Auth. Provider   azithromycin (ZITHROMAX) 200 MG/5ML suspension 12.5 ml po once day one, then 6.193ml po qd for next 4 days 38 mL Payton Mccallumonty, Furman Trentman, MD     1. Labs/x-ray results and diagnosis reviewed with patient 2. rx as per orders above; reviewed possible side effects, interactions, risks and benefits  3. Recommend supportive treatment as above 4. Follow-up prn if symptoms worsen or don't improve   Controlled Substance Prescriptions [place] Controlled Substance Registry consulted? Not Applicable   Payton Mccallumonty, Milla Wahlberg, MD 05/17/18 1050

## 2018-05-17 NOTE — ED Triage Notes (Signed)
Pt with over one week of chest congestion, cough, low grade fever. Would like flu test and CXR.

## 2018-05-17 NOTE — Discharge Instructions (Signed)
Rest, fluids, over the counter cold/cough medications as needed °

## 2019-04-24 IMAGING — CR DG CHEST 2V
2 series · 2 of 2 positions shown · non-contrast
Comparison: None.

CLINICAL DATA: Cough and congestion

EXAM:
CHEST - 2 VIEW

[chest pa]
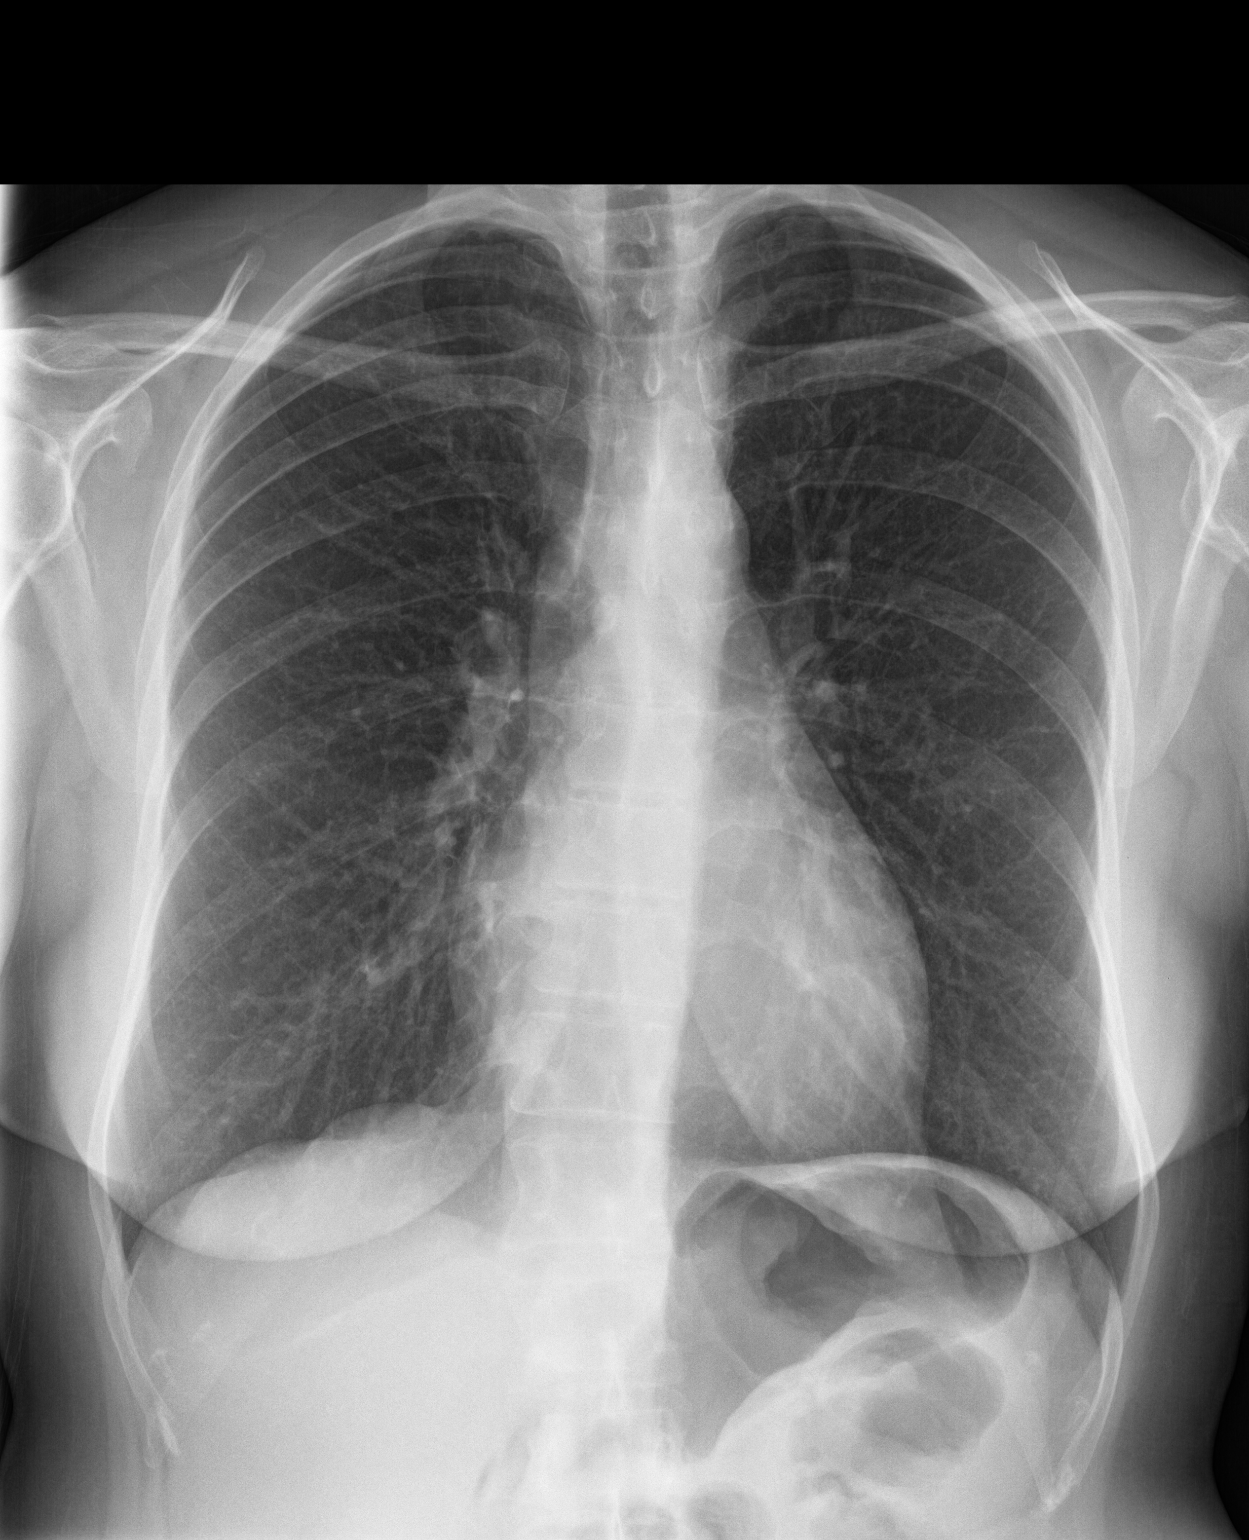

[chest lat]
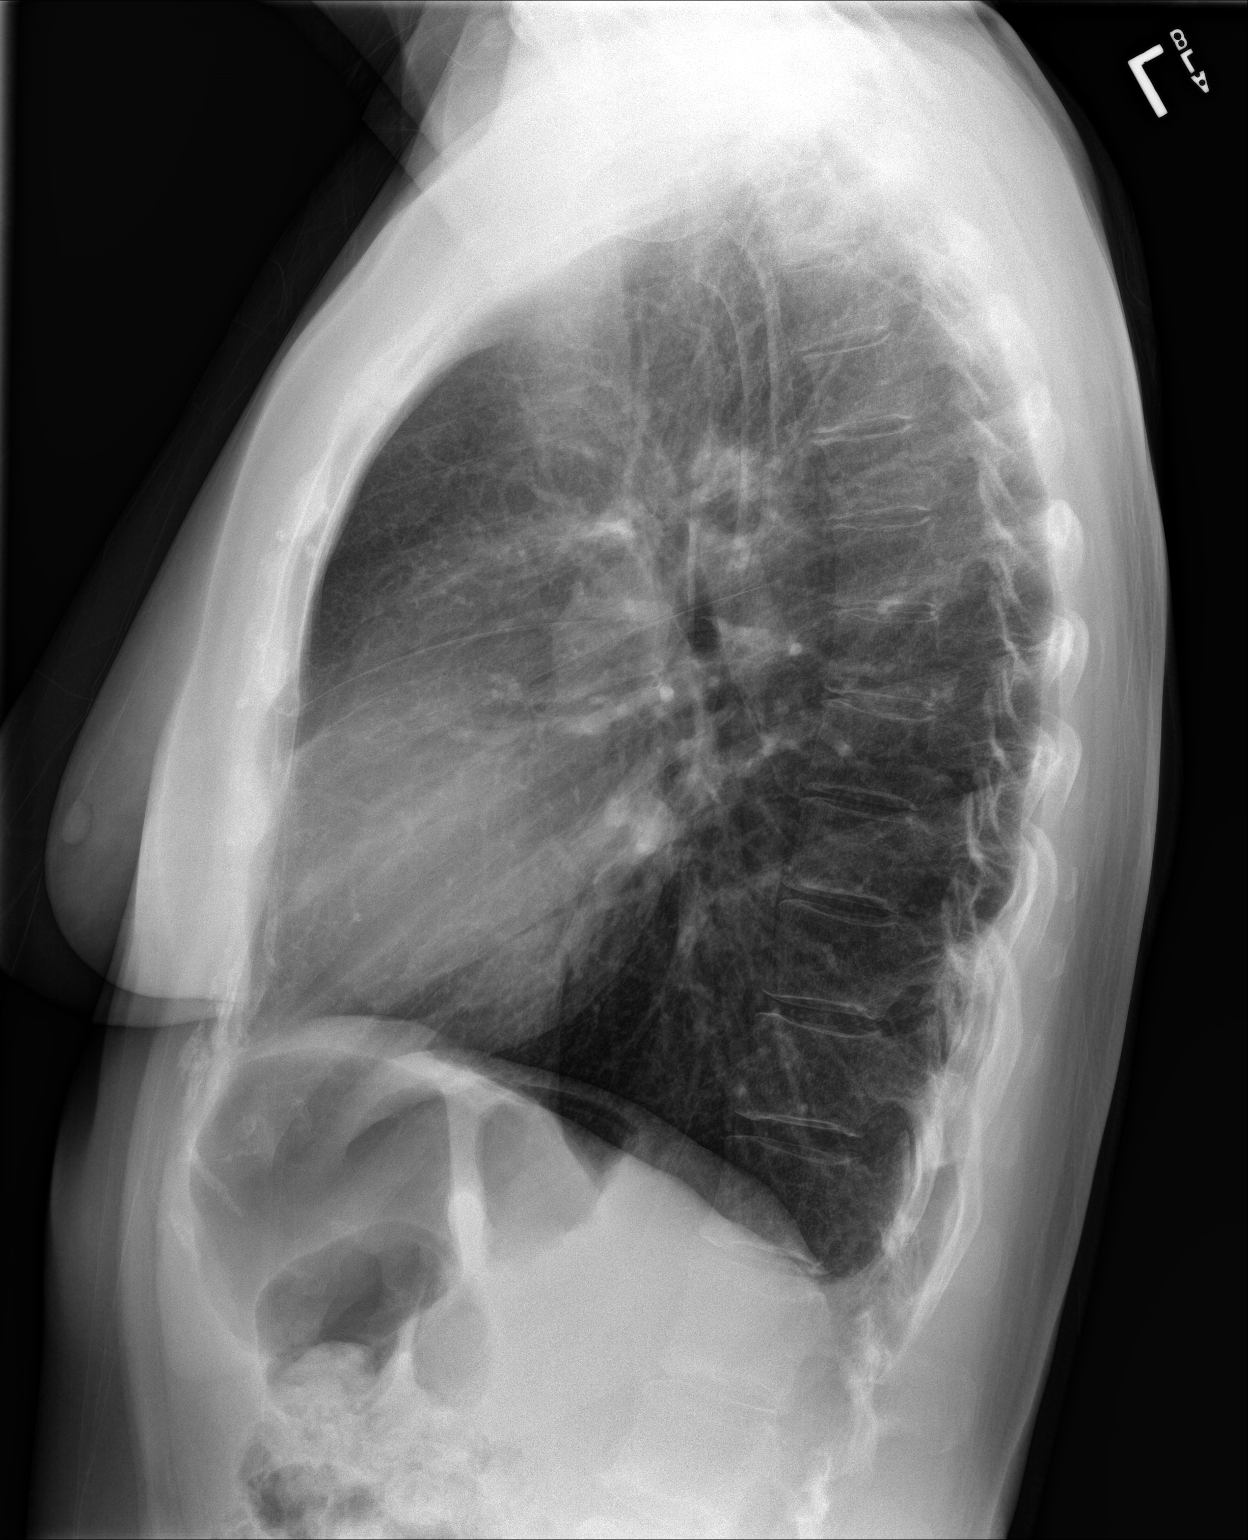

[2 of 2 positions shown; findings below may reference images not displayed]

FINDINGS: Lungs are clear. Heart size and pulmonary vascularity are normal. No
adenopathy. No bone lesions.
IMPRESSION: No edema or consolidation.

## 2020-03-24 ENCOUNTER — Ambulatory Visit: Payer: BLUE CROSS/BLUE SHIELD | Attending: Internal Medicine

## 2020-03-24 DIAGNOSIS — Z23 Encounter for immunization: Secondary | ICD-10-CM

## 2020-03-24 NOTE — Progress Notes (Signed)
   Covid-19 Vaccination Clinic  Name:  Michelle Barber    MRN: [id number] DOB: [birth year]  03/24/2020  [name] was observed post Covid-19 immunization for 15 minutes without incident. She was provided with Vaccine Information Sheet and instruction to access the V-Safe system.   [name] was instructed to call 911 with any severe reactions post vaccine: Marland Kitchen Difficulty breathing  . Swelling of face and throat  . A fast heartbeat  . A bad rash all over body  . Dizziness and weakness   Immunizations Administered    Name Date Dose VIS Date Route   Pfizer COVID-19 Vaccine 03/24/2020  2:37 PM 0.3 mL 02/20/2020 Intramuscular   Manufacturer: [employer], [employer]   Lot: [id number]   NDC: [address]-9

## 2020-09-08 ENCOUNTER — Other Ambulatory Visit: Payer: Self-pay

## 2020-09-08 ENCOUNTER — Ambulatory Visit
Admission: EM | Admit: 2020-09-08 | Discharge: 2020-09-08 | Disposition: A | Discharge status: Home / Self Care | Payer: BC Managed Care – PPO | Attending: Physician Assistant | Admitting: Physician Assistant

## 2020-09-08 ENCOUNTER — Encounter: Payer: Self-pay | Admitting: Emergency Medicine

## 2020-09-08 DIAGNOSIS — N3000 Acute cystitis without hematuria: Secondary | ICD-10-CM | POA: Insufficient documentation

## 2020-09-08 DIAGNOSIS — R300 Dysuria: Secondary | ICD-10-CM | POA: Insufficient documentation

## 2020-09-08 LAB — URINALYSIS, COMPLETE (UACMP) WITH MICROSCOPIC
Bilirubin Urine: NEGATIVE
Glucose, UA: NEGATIVE mg/dL
Hgb urine dipstick: NEGATIVE
Ketones, ur: NEGATIVE mg/dL
Leukocytes,Ua: NEGATIVE
Nitrite: NEGATIVE
Protein, ur: NEGATIVE mg/dL
Specific Gravity, Urine: >1.03 — ABNORMAL HIGH (ref 1.005–1.030)
pH: 6 (ref 5.0–8.0)

## 2020-09-08 MED ORDER — NITROFURANTOIN MONOHYD MACRO 100 MG PO CAPS: 100.0000 mg | ORAL_CAPSULE | Freq: Two times a day (BID) | ORAL | 0 refills | Status: AC

## 2020-09-08 NOTE — ED Triage Notes (Signed)
Pt c/o dysuria. Started about a week ago. She states she started taking bactrim about 3 days ago and was feeling a little better but today she started feeling worse today and started having back pain.

## 2020-09-08 NOTE — ED Provider Notes (Signed)
[facility]    CSN: [id number] Arrival date & time: 09/08/20  1644      History   Chief Complaint Chief Complaint  Patient presents with  . Dysuria    HPI Michelle Barber is a 55 y.o. female.   Michelle Barber presents with complaints of dysuria and pelvic pressure. Initially started around 4 days ago. She started taking left-over bactrim from previous UTI and it briefly helped for about a day, but symptoms have since worsened. Some low back pain and suprapubic pain. No vaginal symptoms. Hysterectomy. Similar to previous UTI's. No known fevers. No blood to urine. Pyridium also has not helped with symptoms.    ROS per HPI, negative if not otherwise mentioned.      Past Medical History:  Diagnosis Date  . Medical history non-contributory     Patient Active Problem List   Diagnosis Date Noted  . Encounter for screening colonoscopy     Past Surgical History:  Procedure Laterality Date  . ABDOMINAL HYSTERECTOMY    . BLADDER SURGERY     Bladder Tac  . COLONOSCOPY WITH PROPOFOL N/A 11/25/2017   Procedure: COLONOSCOPY WITH PROPOFOL;  Surgeon: Midge Minium, MD;  [facility];  Service: Endoscopy;  Laterality: N/A;  needs to be first    OB History   No obstetric history on file.      Home Medications    Prior to Admission medications   Medication Sig Start Date End Date Taking? Authorizing Provider  Cholecalciferol (VITAMIN D PO) Take by mouth.   Yes [provider]  nitrofurantoin, macrocrystal-monohydrate, (MACROBID) 100 MG capsule Take 1 capsule (100 mg total) by mouth 2 (two) times daily for 5 days. 09/08/20 09/13/20 Yes Georgetta Haber, NP    Family History Family History  Problem Relation Age of Onset  . Breast cancer Mother   . Colon cancer Paternal Grandmother     Social History Social History   Tobacco Use  . Smoking status: Never Smoker  . Smokeless tobacco: Never Used  Vaping Use  . Vaping Use: Never used   Substance Use Topics  . Alcohol use: Yes    Comment: rare -1-2x/yr  . Drug use: No     Allergies   Other   Review of Systems Review of Systems   Physical Exam Triage Vital Signs ED Triage Vitals  Enc Vitals Group     BP 09/08/20 1707 (!) 133/99     Pulse Rate 09/08/20 1707 77     Resp 09/08/20 1707 18     Temp 09/08/20 1707 98.3 F (36.8 C)     Temp Source 09/08/20 1707 Oral     SpO2 09/08/20 1707 100 %     Weight 09/08/20 1705 169 lb 15.6 oz (77.1 kg)     Height 09/08/20 1705 5\' 7"  (1.702 m)     Head Circumference --      Peak Flow --      Pain Score 09/08/20 1705 4     Pain Loc --      Pain Edu? --      Excl. in GC? --    No data found.  Updated Vital Signs BP (!) 133/99 (BP Location: Right Arm)   Pulse 77   Temp 98.3 F (36.8 C) (Oral)   Resp 18   Ht 5\' 7"  (1.702 m)   Wt 169 lb 15.6 oz (77.1 kg)   SpO2 100%   BMI 26.62 kg/m   Visual  Acuity Right Eye Distance:   Left Eye Distance:   Bilateral Distance:    Right Eye Near:   Left Eye Near:    Bilateral Near:     Physical Exam Constitutional:      General: She is not in acute distress.    Appearance: She is well-developed.  Cardiovascular:     Rate and Rhythm: Normal rate.  Pulmonary:     Effort: Pulmonary effort is normal.  Abdominal:     Tenderness: There is no right CVA tenderness or left CVA tenderness.     Comments: Complaints of low back pain  Genitourinary:    Comments: Denies vaginal symptoms Skin:    General: Skin is warm and dry.  Neurological:     Mental Status: She is alert and oriented to person, place, and time.      UC Treatments / Results  Labs (all labs ordered are listed, but only abnormal results are displayed) Labs Reviewed  URINALYSIS, COMPLETE (UACMP) WITH MICROSCOPIC - Abnormal; Notable for the following components:      Result Value   Specific Gravity, Urine >1.030 (*)    Bacteria, UA FEW (*)    All other components within normal limits  URINE CULTURE     EKG   Radiology No results found.  Procedures Procedures (including critical care time)  Medications Ordered in UC Medications - No data to display  Initial Impression / Assessment and Plan / UC Course  I have reviewed the triage vital signs and the nursing notes.  Pertinent labs & imaging results that were available during my care of the patient were reviewed by me and considered in my medical decision making (see chart for details).     Urine is fairly unremarkable tonight, but she has been taking bactrim. macrobid has been effective in the past, opted to switch to this pending urine culture. Return precautions provided. Patient verbalized understanding and agreeable to plan.   Final Clinical Impressions(s) / UC Diagnoses   Final diagnoses:  Dysuria  Acute cystitis without hematuria     Discharge Instructions     Your urine looks fair today- abnormals only include elevated specific gravity and a few bacteria on micro.  I have sent it to be cultured.  Increase your water intake.  You can start macrobid while culture pends.      ED Prescriptions    Medication Sig Dispense Auth. Provider   nitrofurantoin, macrocrystal-monohydrate, (MACROBID) 100 MG capsule Take 1 capsule (100 mg total) by mouth 2 (two) times daily for 5 days. 10 capsule Georgetta Haber, NP     PDMP not reviewed this encounter.   Georgetta Haber, NP 09/08/20 [phone]

## 2020-09-08 NOTE — Discharge Instructions (Signed)
Your urine looks fair today- abnormals only include elevated specific gravity and a few bacteria on micro.  I have sent it to be cultured.  Increase your water intake.  You can start macrobid while culture pends.

## 2020-09-10 LAB — URINE CULTURE: Culture: NO GROWTH

## 2020-10-15 ENCOUNTER — Ambulatory Visit: Payer: BLUE CROSS/BLUE SHIELD | Attending: Internal Medicine

## 2020-10-15 DIAGNOSIS — Z23 Encounter for immunization: Secondary | ICD-10-CM

## 2020-10-15 NOTE — Progress Notes (Signed)
   Covid-19 Vaccination Clinic  Name:  Michelle Barber    MRN: [id number] DOB: Dec 30, 1965  10/15/2020  Ms. [name] was observed post Covid-19 immunization for 15 minutes without incident. She was provided with Vaccine Information Sheet and instruction to access the V-Safe system.   Ms. [name] was instructed to call 911 with any severe reactions post vaccine: Difficulty breathing  Swelling of face and throat  A fast heartbeat  A bad rash all over body  Dizziness and weakness   Drusilla Kanner, PharmD, MBA Clinical Acute Care Pharmacist

## 2020-10-21 ENCOUNTER — Other Ambulatory Visit (HOSPITAL_COMMUNITY): Payer: Self-pay

## 2020-10-21 MED ORDER — COVID-19 MRNA VACC (MODERNA) 100 MCG/0.5ML IM SUSP
INTRAMUSCULAR | 0 refills | Status: AC
  Filled 2020-10-21: qty 0.5, 17d supply, fill #0

## 2020-10-24 ENCOUNTER — Other Ambulatory Visit (HOSPITAL_COMMUNITY): Payer: Self-pay

## 2021-09-15 ENCOUNTER — Ambulatory Visit: Payer: Self-pay | Admitting: Internal Medicine
